# Patient Record
Sex: Male | Born: 1960 | Race: White | Hispanic: No | Marital: Married | Smoking: Former smoker
Health system: Southern US, Community
[De-identification: ages and names within clinical notes are randomized; demographics above are authoritative.]

## PROBLEM LIST (undated history)

## (undated) DIAGNOSIS — D72819 Decreased white blood cell count, unspecified: Secondary | ICD-10-CM

## (undated) DIAGNOSIS — C61 Malignant neoplasm of prostate: Secondary | ICD-10-CM

## (undated) DIAGNOSIS — D563 Thalassemia minor: Secondary | ICD-10-CM

## (undated) HISTORY — DX: Thalassemia minor: D56.3

## (undated) HISTORY — PX: PROSTATE BIOPSY: SHX241

## (undated) HISTORY — DX: Decreased white blood cell count, unspecified: D72.819

---

## 2010-06-18 ENCOUNTER — Ambulatory Visit: Payer: Self-pay | Admitting: Hematology & Oncology

## 2010-09-09 ENCOUNTER — Ambulatory Visit (HOSPITAL_BASED_OUTPATIENT_CLINIC_OR_DEPARTMENT_OTHER): Payer: Federal, State, Local not specified - PPO | Admitting: Hematology & Oncology

## 2010-09-09 ENCOUNTER — Other Ambulatory Visit: Payer: Self-pay | Admitting: Hematology & Oncology

## 2010-09-09 DIAGNOSIS — D72819 Decreased white blood cell count, unspecified: Secondary | ICD-10-CM

## 2010-09-09 DIAGNOSIS — D649 Anemia, unspecified: Secondary | ICD-10-CM

## 2010-09-09 LAB — CBC WITH DIFFERENTIAL (CANCER CENTER ONLY)
BASO#: 0 10*3/uL (ref 0.0–0.2)
BASO%: 0.9 % (ref 0.0–2.0)
EOS%: 6.8 % (ref 0.0–7.0)
HCT: 45 % (ref 38.7–49.9)
HGB: 15.5 g/dL (ref 13.0–17.1)
LYMPH%: 48.1 % — ABNORMAL HIGH (ref 14.0–48.0)
MCHC: 34.4 g/dL (ref 32.0–35.9)
MCV: 74 fL — ABNORMAL LOW (ref 82–98)
NEUT%: 30.5 % — ABNORMAL LOW (ref 40.0–80.0)
RDW: 15.3 % (ref 11.1–15.7)

## 2010-09-11 LAB — IRON AND TIBC: TIBC: 289 ug/dL (ref 215–435)

## 2010-09-11 LAB — HEMOGLOBINOPATHY EVALUATION
Hgb A2 Quant: 6.3 % — ABNORMAL HIGH (ref 2.2–3.2)
Hgb A: 93.3 % — ABNORMAL LOW (ref 96.8–97.8)
Hgb F Quant: 0.4 % (ref 0.0–2.0)
Hgb S Quant: 0 % (ref 0.0–0.0)

## 2010-09-11 LAB — LACTATE DEHYDROGENASE: LDH: 107 U/L (ref 94–250)

## 2010-09-11 LAB — RHEUMATOID FACTOR: Rhuematoid fact SerPl-aCnc: <10 IU/mL (ref <=14)

## 2010-09-11 LAB — ANA: Anti Nuclear Antibody(ANA): NEGATIVE

## 2010-12-18 ENCOUNTER — Telehealth: Payer: Self-pay | Admitting: *Deleted

## 2010-12-18 ENCOUNTER — Telehealth: Payer: Self-pay | Admitting: Hematology & Oncology

## 2010-12-18 NOTE — Telephone Encounter (Signed)
Alexis Swanson at [facility] requested New Patient Evaluation dated 09/09/10.  Faxed to [phone].

## 2010-12-18 NOTE — Telephone Encounter (Signed)
Left message with 1-14 time

## 2011-02-17 ENCOUNTER — Encounter: Payer: Self-pay | Admitting: Hematology & Oncology

## 2011-02-17 ENCOUNTER — Other Ambulatory Visit (HOSPITAL_BASED_OUTPATIENT_CLINIC_OR_DEPARTMENT_OTHER): Payer: Federal, State, Local not specified - PPO | Admitting: Lab

## 2011-02-17 ENCOUNTER — Other Ambulatory Visit: Payer: Self-pay | Admitting: Hematology & Oncology

## 2011-02-17 ENCOUNTER — Telehealth: Payer: Self-pay | Admitting: Hematology & Oncology

## 2011-02-17 ENCOUNTER — Ambulatory Visit: Payer: Federal, State, Local not specified - PPO | Admitting: Hematology & Oncology

## 2011-02-17 ENCOUNTER — Other Ambulatory Visit: Payer: Federal, State, Local not specified - PPO | Admitting: Lab

## 2011-02-17 ENCOUNTER — Ambulatory Visit (HOSPITAL_BASED_OUTPATIENT_CLINIC_OR_DEPARTMENT_OTHER): Payer: Federal, State, Local not specified - PPO | Admitting: Hematology & Oncology

## 2011-02-17 DIAGNOSIS — D563 Thalassemia minor: Secondary | ICD-10-CM

## 2011-02-17 DIAGNOSIS — D72819 Decreased white blood cell count, unspecified: Secondary | ICD-10-CM

## 2011-02-17 HISTORY — DX: Thalassemia minor: D56.3

## 2011-02-17 HISTORY — DX: Decreased white blood cell count, unspecified: D72.819

## 2011-02-17 LAB — CBC WITH DIFFERENTIAL (CANCER CENTER ONLY)
BASO#: 0 10*3/uL (ref 0.0–0.2)
Eosinophils Absolute: 0.3 10*3/uL (ref 0.0–0.5)
HGB: 16.6 g/dL (ref 13.0–17.1)
LYMPH%: 50.6 % — ABNORMAL HIGH (ref 14.0–48.0)
MCV: 75 fL — ABNORMAL LOW (ref 82–98)
MONO#: 0.4 10*3/uL (ref 0.1–0.9)
Platelets: 202 10*3/uL (ref 145–400)
RBC: 6.55 10*6/uL — ABNORMAL HIGH (ref 4.20–5.70)
WBC: 3.1 10*3/uL — ABNORMAL LOW (ref 4.0–10.0)

## 2011-02-17 LAB — RETICULOCYTES (CHCC): Retic Ct Pct: 0.6 % (ref 0.4–2.3)

## 2011-02-17 LAB — CHCC SATELLITE - SMEAR

## 2011-02-17 LAB — IRON AND TIBC: %SAT: 51 % (ref 20–55)

## 2011-02-17 NOTE — Progress Notes (Signed)
CC:   Jackie Plum, M.D.  DIAGNOSES: 1. Leukopenia, chronic, benign. 2. Beta thalassemia trait.  CURRENT THERAPY:  Observation.  INTERIM HISTORY:  Alexis Swanson comes in for followup.  We first saw him back in August.  At that point in time, everything looked fine with respect to his workup for the leukopenia.  His vitamin B12 was normal.  He had a negative ANA and rheumatoid factor.  We did do a hemoglobin electrophoresis on him.  Surprisingly enough, he does not have sickle cell.  He did not have any hemoglobin S with the hemoglobin electrophoresis.  He did have an elevated hemoglobin A2. Therefore, I would classify him as a beta thalassemia trait.  He is still working full time.  He had no problems over the holidays. He was busy.  There has been no cough or shortness breath.  He has no problems with the "flu."  PHYSICAL EXAM:  General:  This is a well-developed, well-nourished black gentleman in no obvious distress.  Vital Signs:  Temperature of 98, pulse 73, respiratory rate 14, blood pressure 119/67.  Weight is 186. Head/Neck:  Exam shows a normocephalic, atraumatic skull.  There are no ocular or oral lesions.  There are no palpable cervical or supraclavicular lymph nodes.  Lungs:  Clear bilaterally.  Cardiac: Regular rate and rhythm with a normal S1, S2.  There are no murmurs, rubs, or bruits.  Abdomen:  Soft with good bowel sounds.  There is no palpable abdominal mass.  There is no fluid wave.  There is no palpable hepatosplenomegaly.  Extremities:  No clubbing, cyanosis or edema. There is no tenderness to palpation of his bones.  Skin:  No rash, ecchymosis or petechiae.  Neurologic:  No focal neurological deficits.  LABORATORY STUDIES:  White cell count 3.1, hemoglobin 16.6, hematocrit 49, platelet count 202.  MCV 75.  White cell differential shows 26 segs, 51 lymphocytes, 13 monos, 10 eosinophils.  Peripheral smear shows good maturation of his red blood cells.  He does  have a few target cells.  I see no nucleated red blood cells.  He has no teardrop cells.  There is no rouleaux formation.  White cells are mature.  He has an increase in lymphocytes.  The lymphocytes do appear to be mature without any large atypical lymphocytes.  He has a slight increase in eosinophils, again which appear mature.  Platelets are adequate in number and size.  IMPRESSION:  Alexis Swanson is a 51 year old African gentleman with leukopenia.  I have to believe that this is a chronic benign leukopenia. All his blood work looks good.  His blood smear looks fine.  I believe that we can just get him back in 6 more months.  I think if everything looks good in 6 months, then we can probably let him go from the clinic.  He does have a thalassemia trait.  He is asymptomatic with this.  He is on folic acid, which is critical.    ______________________________ Josph Macho, M.D. PRE/MEDQ  D:  02/17/2011  T:  02/17/2011  Job:  [id number]  ADDENDUM:  Ferritin is 477 and %Sat is 51.

## 2011-02-17 NOTE — Progress Notes (Signed)
This office note has been dictated.

## 2011-02-17 NOTE — Telephone Encounter (Signed)
Mailed 08-13-11 schedule

## 2011-04-02 ENCOUNTER — Other Ambulatory Visit (HOSPITAL_BASED_OUTPATIENT_CLINIC_OR_DEPARTMENT_OTHER): Payer: Self-pay | Admitting: Internal Medicine

## 2011-04-02 ENCOUNTER — Ambulatory Visit (HOSPITAL_BASED_OUTPATIENT_CLINIC_OR_DEPARTMENT_OTHER)
Admission: RE | Admit: 2011-04-02 | Discharge: 2011-04-02 | Disposition: A | Discharge status: Home / Self Care | Payer: Federal, State, Local not specified - PPO | Source: Ambulatory Visit | Attending: Internal Medicine | Admitting: Internal Medicine

## 2011-04-02 DIAGNOSIS — R52 Pain, unspecified: Secondary | ICD-10-CM

## 2011-04-02 DIAGNOSIS — M25559 Pain in unspecified hip: Secondary | ICD-10-CM | POA: Insufficient documentation

## 2011-04-02 MED ORDER — IOHEXOL 300 MG/ML  SOLN: 100.0000 mL | Freq: Once | INTRAMUSCULAR | Status: AC | PRN

## 2011-04-05 ENCOUNTER — Ambulatory Visit (HOSPITAL_BASED_OUTPATIENT_CLINIC_OR_DEPARTMENT_OTHER)
Admission: RE | Admit: 2011-04-05 | Discharge: 2011-04-05 | Disposition: A | Discharge status: Home / Self Care | Payer: Federal, State, Local not specified - PPO | Source: Ambulatory Visit | Attending: Internal Medicine | Admitting: Internal Medicine

## 2011-04-05 DIAGNOSIS — M8560 Other cyst of bone, unspecified site: Secondary | ICD-10-CM | POA: Insufficient documentation

## 2011-04-05 DIAGNOSIS — N400 Enlarged prostate without lower urinary tract symptoms: Secondary | ICD-10-CM

## 2011-04-05 DIAGNOSIS — R209 Unspecified disturbances of skin sensation: Secondary | ICD-10-CM

## 2011-04-05 DIAGNOSIS — M25559 Pain in unspecified hip: Secondary | ICD-10-CM

## 2011-04-05 DIAGNOSIS — M8569 Other cyst of bone, multiple sites: Secondary | ICD-10-CM

## 2011-04-05 DIAGNOSIS — R52 Pain, unspecified: Secondary | ICD-10-CM

## 2011-08-11 ENCOUNTER — Other Ambulatory Visit (HOSPITAL_BASED_OUTPATIENT_CLINIC_OR_DEPARTMENT_OTHER): Payer: Federal, State, Local not specified - PPO | Admitting: Lab

## 2011-08-11 ENCOUNTER — Ambulatory Visit (HOSPITAL_BASED_OUTPATIENT_CLINIC_OR_DEPARTMENT_OTHER): Payer: Federal, State, Local not specified - PPO | Admitting: Hematology & Oncology

## 2011-08-11 VITALS — BP 125/69 | HR 72 | Temp 97.7°F | Ht 71.0 in | Wt 190.0 lb

## 2011-08-11 DIAGNOSIS — D72819 Decreased white blood cell count, unspecified: Secondary | ICD-10-CM

## 2011-08-11 DIAGNOSIS — D563 Thalassemia minor: Secondary | ICD-10-CM

## 2011-08-11 LAB — CBC WITH DIFFERENTIAL (CANCER CENTER ONLY)
BASO#: 0.1 10*3/uL (ref 0.0–0.2)
BASO%: 1 % (ref 0.0–2.0)
EOS%: 7 % (ref 0.0–7.0)
MCH: 25.6 pg — ABNORMAL LOW (ref 28.0–33.4)
MCHC: 33.7 g/dL (ref 32.0–35.9)
MONO%: 12.9 % (ref 0.0–13.0)
NEUT#: 1.6 10*3/uL (ref 1.5–6.5)
Platelets: 188 10*3/uL (ref 145–400)
RDW: 14.7 % (ref 11.1–15.7)

## 2011-08-11 LAB — CHCC SATELLITE - SMEAR

## 2011-08-11 NOTE — Progress Notes (Signed)
CC:   Jackie Plum, M.D.  DIAGNOSES: 1. Chronic leukopenia, benign. 2. Beta thalassemia trait.  CURRENT THERAPY:  Observation.  INTERIM HISTORY:  Mr. Alexis Swanson comes in for followup.  We last saw him back in January.  Since then he has been doing real well.  He is working without any difficulty.  He has had no problem with infections.  He has had no nausea or vomiting.  There has been no rash.  He has had no change in bowel or bladder habits.  PHYSICAL EXAMINATION:  General:  This is a well-developed, well- nourished black gentleman in no obvious distress.  Vital signs: Temperature 97.7, pulse 72, respiratory rate 18, blood pressure 125/69. Weight is 190.  Head and neck:  Shows a normocephalic, atraumatic skull. There are no ocular or oral lesions.  There are no palpable cervical or supraclavicular lymph nodes.  Lungs:  Clear bilaterally.  Cardiac: Regular rate and rhythm with a normal S1 and S2.  There are no murmurs, rubs or bruits.  Abdomen:  Soft with good bowel sounds.  There is no palpable abdominal mass.  There is no fluid wave.  There is no palpable hepatosplenomegaly.  Back:  No tenderness over the spine, ribs or hips. Extremities:  Show no clubbing, cyanosis or edema.  Neurological:  Shows no focal neurological deficits.  LABORATORY STUDIES:  White cell count 4.9, hemoglobin 14.9, hematocrit 44.2, platelet count 188.  IMPRESSION:  Mr. Alexis Swanson is a 51 year old gentleman with transient leukopenia.  I think this is not surprising to me.  I suspect that blood counts will rise and fall.  I just do not think this will be unusual.  We will go ahead and plan to get him back in 6 more months.  I do not see that we need any blood work in-between visits.    ______________________________ Josph Macho, M.D. PRE/MEDQ  D:  08/11/2011  T:  08/11/2011  Job:  [id number]

## 2011-08-11 NOTE — Progress Notes (Signed)
This office note has been dictated.

## 2011-08-13 ENCOUNTER — Other Ambulatory Visit: Payer: Federal, State, Local not specified - PPO | Admitting: Lab

## 2011-08-13 ENCOUNTER — Ambulatory Visit: Payer: Federal, State, Local not specified - PPO | Admitting: Hematology & Oncology

## 2011-12-05 ENCOUNTER — Other Ambulatory Visit: Payer: Self-pay | Admitting: *Deleted

## 2011-12-05 DIAGNOSIS — D563 Thalassemia minor: Secondary | ICD-10-CM

## 2011-12-05 DIAGNOSIS — D72819 Decreased white blood cell count, unspecified: Secondary | ICD-10-CM

## 2011-12-05 MED ORDER — FOLIC ACID 1 MG PO TABS: 1.0000 mg | ORAL_TABLET | Freq: Two times a day (BID) | ORAL | Status: DC

## 2011-12-05 NOTE — Telephone Encounter (Signed)
Received refill request from [facility] for Folic Acid 1 mg BID. This is a chronic med for the pt. Sent via e-prescribe.

## 2012-02-09 ENCOUNTER — Ambulatory Visit (HOSPITAL_BASED_OUTPATIENT_CLINIC_OR_DEPARTMENT_OTHER): Payer: Federal, State, Local not specified - PPO | Admitting: Hematology & Oncology

## 2012-02-09 ENCOUNTER — Other Ambulatory Visit (HOSPITAL_BASED_OUTPATIENT_CLINIC_OR_DEPARTMENT_OTHER): Payer: Federal, State, Local not specified - PPO | Admitting: Lab

## 2012-02-09 VITALS — BP 113/61 | HR 63 | Temp 98.0°F | Resp 18 | Ht 72.0 in | Wt 196.0 lb

## 2012-02-09 DIAGNOSIS — D72819 Decreased white blood cell count, unspecified: Secondary | ICD-10-CM

## 2012-02-09 LAB — CBC WITH DIFFERENTIAL (CANCER CENTER ONLY)
BASO%: 0.8 % (ref 0.0–2.0)
LYMPH%: 50.4 % — ABNORMAL HIGH (ref 14.0–48.0)
MCV: 75 fL — ABNORMAL LOW (ref 82–98)
MONO#: 0.5 10*3/uL (ref 0.1–0.9)
NEUT#: 0.9 10*3/uL — ABNORMAL LOW (ref 1.5–6.5)
Platelets: 177 10*3/uL (ref 145–400)
RDW: 15.8 % — ABNORMAL HIGH (ref 11.1–15.7)
WBC: 3.6 10*3/uL — ABNORMAL LOW (ref 4.0–10.0)

## 2012-02-09 LAB — CHCC SATELLITE - SMEAR

## 2012-02-09 NOTE — Progress Notes (Signed)
This office note has been dictated.

## 2012-02-10 NOTE — Progress Notes (Signed)
CC:   Jackie Plum, M.D.  DIAGNOSES: 1. Benign chronic leukopenia, ethnicity-associated. 2. Beta thalassemia trait.  CURRENT THERAPY:  Observation.  INTERIM HISTORY:  Alexis Swanson comes in for followup.  We last saw him back in July.  Since then, he has had no problems.  He has had no fevers, sweats, or chills.  He has had no rashes.  He is still working full-time driving a truck locally.  He has had no rashes.  He has had no change in bowel or bladder habits. There has been no change in medications.  PHYSICAL EXAMINATION:  This is a well-developed, well-nourished African American gentleman in no obvious distress.  Vital signs:  Temperature of 98, pulse 63, respiratory rate 18, blood pressure 113/61.  Weight is 196.  Head and neck:  Normocephalic, atraumatic skull.  There are no ocular or oral lesions.  There are no palpable cervical or supraclavicular lymph nodes.  Lungs:  Clear bilaterally.  Cardiac: Regular rate and rhythm with a normal S1, S2.  There are no murmurs, rubs, or bruits.  Abdomen:  Soft with good bowel sounds.  There is no palpable abdominal mass.  There is no palpable hepatosplenomegaly. Extremities:  Shows no clubbing, cyanosis or edema.  Skin:  No rashes, ecchymosis, or petechia.  LABORATORY STUDIES:  White cell count is 3.6, hemoglobin 15.3, hematocrit 46.5, platelet count 177.  White cell differential shows 25 segs, 50 lymphs, 12 monos, and 11 eosinophils.  Peripheral smear shows a normochromic, normocytic population of red blood cells.  He has a couple of target cells.  There is no nucleated red blood cells.  White cells are mature.  He does have an increase in lymphocytes which appear mature.  There are no hypersegmented polys. There are no immature myeloid cells.  There may be slight increase in eosinophils.  Platelets appear adequate.  IMPRESSION:  Alexis Swanson is a 52 year old African American gentleman with leukopenia.  I have to believe that this is a  benign leukopenia associated with his African ethnicity.  This is a definite disease state that is recognized clinically now.  I just do not see anything on his blood smear that looks troublesome. The fact that he has an increase in lymphocytes goes along with the ethnic-associated leukopenia.  He does have a slight increase in eosinophils.  Again, this may fluctuate, but I do not believe this is clinically significant.  I told Mr. Vick that as nice as he is, I do not think we need to see him back in the office.  I just do not think we are going to do anything for him.  Again, we had good fellowship.  He is a real nice guy.    ______________________________ Josph Macho, M.D. PRE/MEDQ  D:  02/09/2012  T:  02/10/2012  Job:  [id number]

## 2013-07-28 IMAGING — CT CT HIP*R* W/CM
3 of 7 series · 14 of 46 positions shown, 16 images · IV contrast (agent unspecified)
Comparison: None.

CLINICAL DATA: Right hip pain.

CT OF THE RIGHT HIP WITH CONTRAST
TECHNIQUE: Multidetector CT imaging was performed following the
standard protocol during bolus administration of intravenous
contrast.
Contrast:  100 ml of Imnipaque-K44.

[Series 4: pelvis 2.0 coronal · coronal · 0.44mm/px · 3 of 100 slices shown]
[im 25/100  soft-tissue]
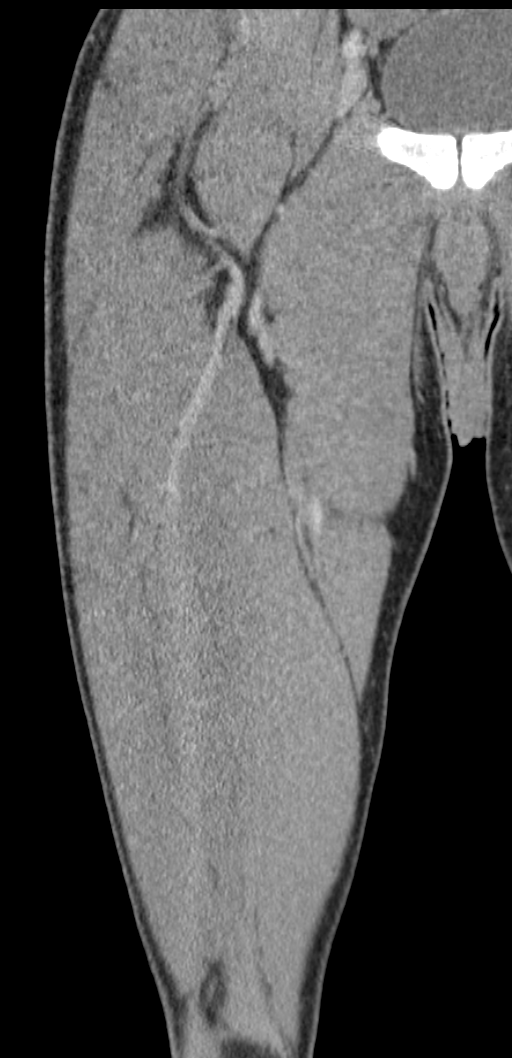
[im 50/100  soft-tissue]
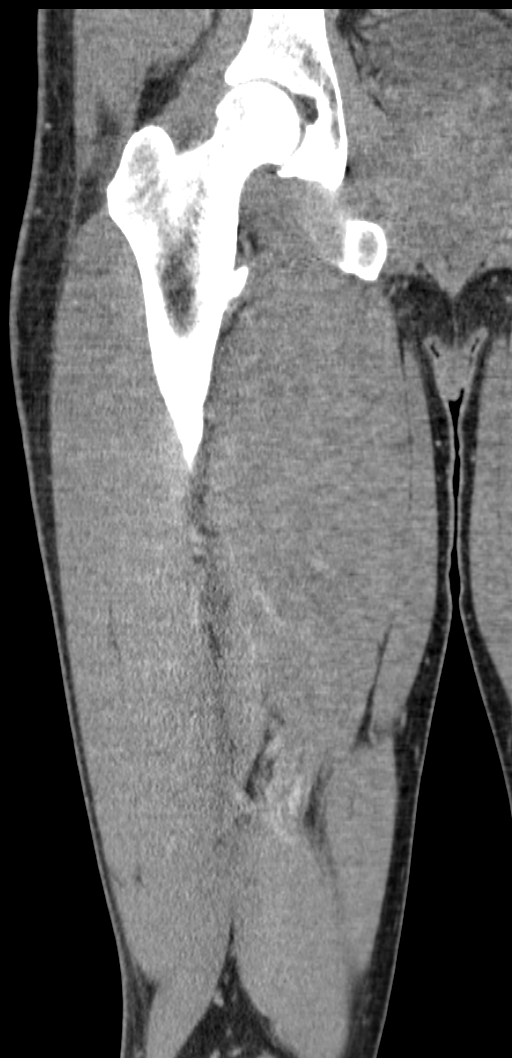
[im 75/100  soft-tissue]
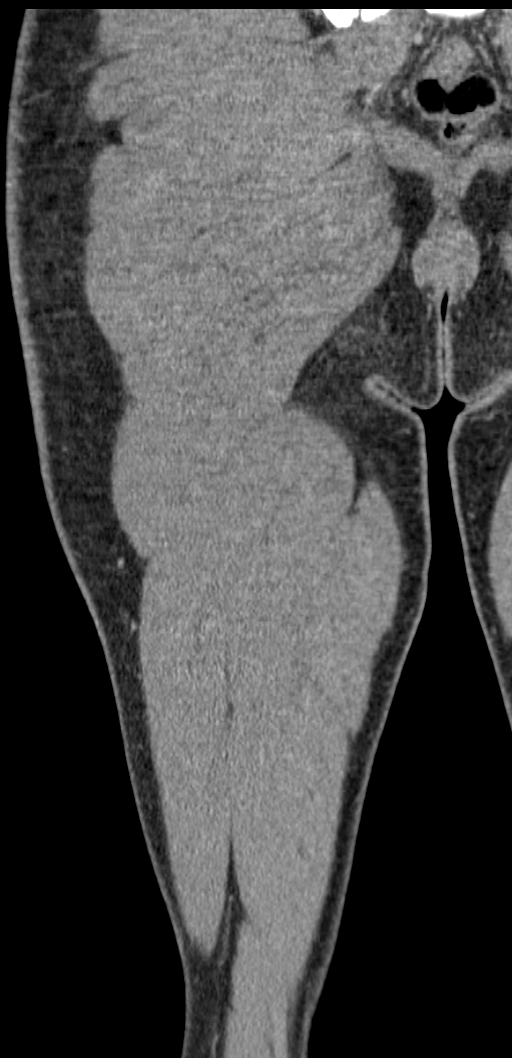

[Series 7: pelvis 5.0 b31f · axial · 0.46mm/px · z∈[+706,+1066]mm · 9 of 91 slices shown, 11 images (1 of 2)]
[im 10/91  soft-tissue]
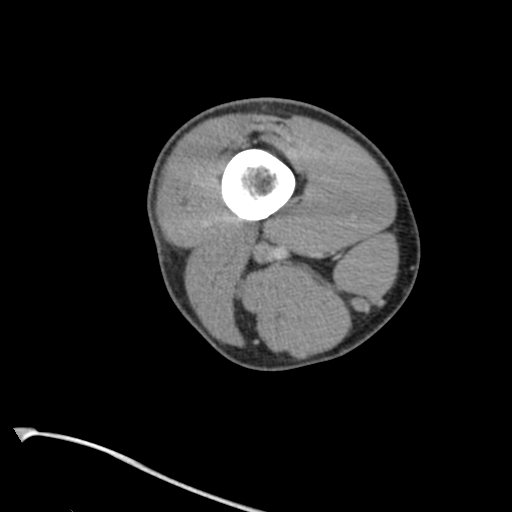
[im 10/91  bone]
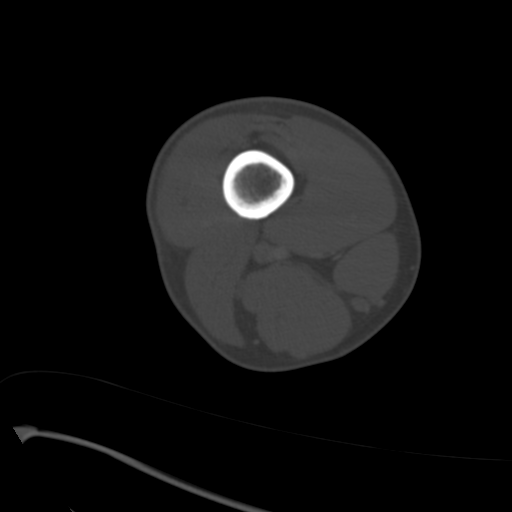
[im 19/91  soft-tissue]
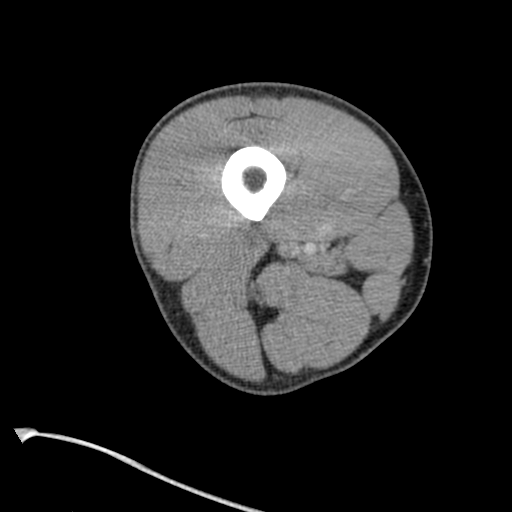
[im 28/91  soft-tissue]
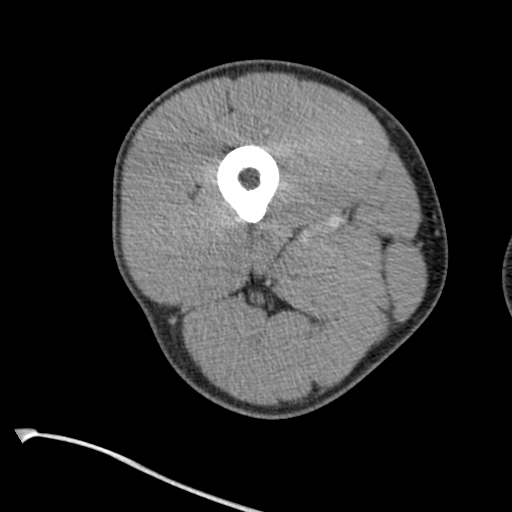
[im 37/91  soft-tissue]
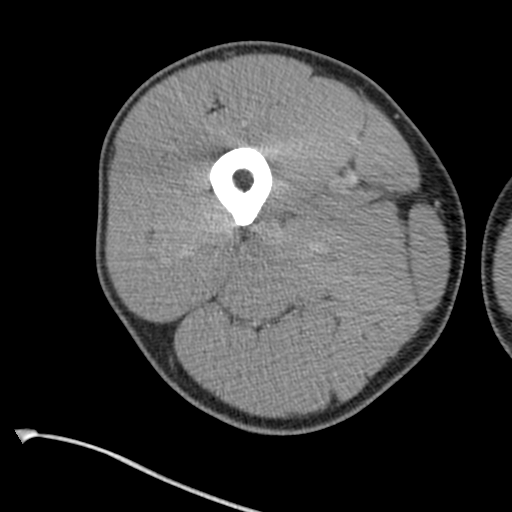
[im 46/91  soft-tissue]
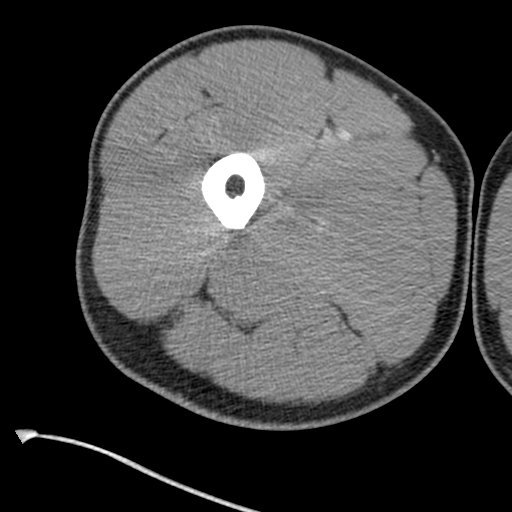
[im 55/91  soft-tissue]
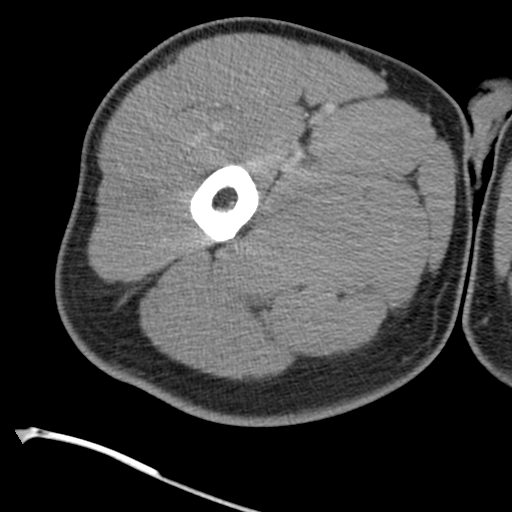
[im 64/91  soft-tissue]
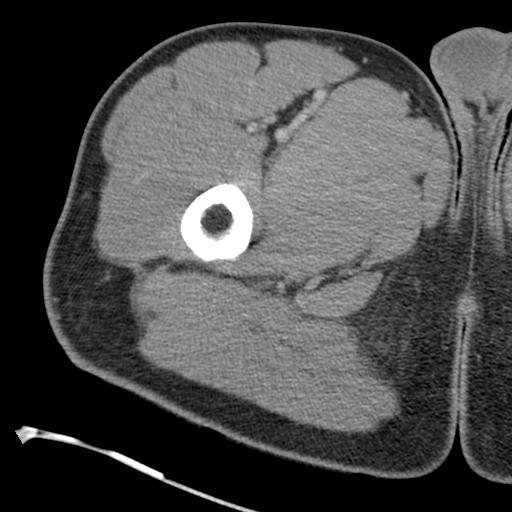
[im 73/91  soft-tissue]
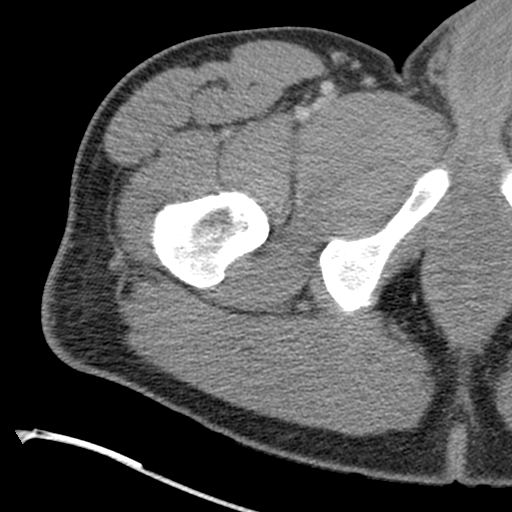
[im 82/91  soft-tissue]
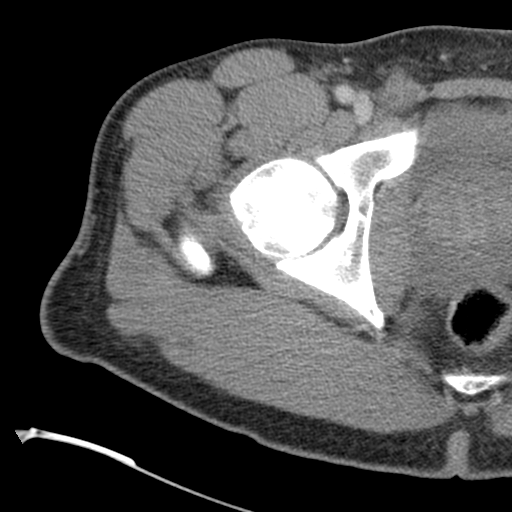
[im 82/91  bone]
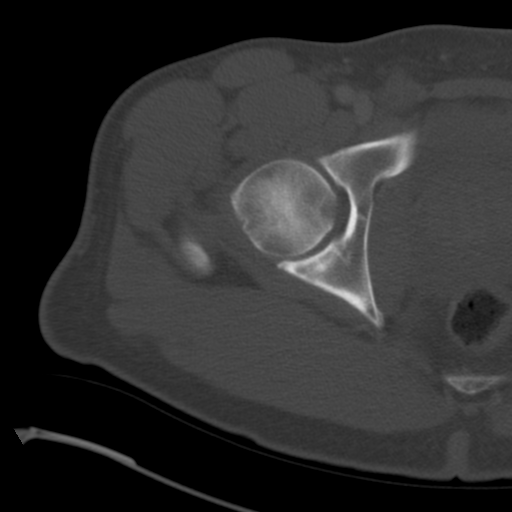

[Series 10: pelvis 5.0 b31f · axial · 0.46mm/px · z∈[+706,+751]mm · 2 of 91 slices shown (2 of 2)]
[im 10/91  soft-tissue]
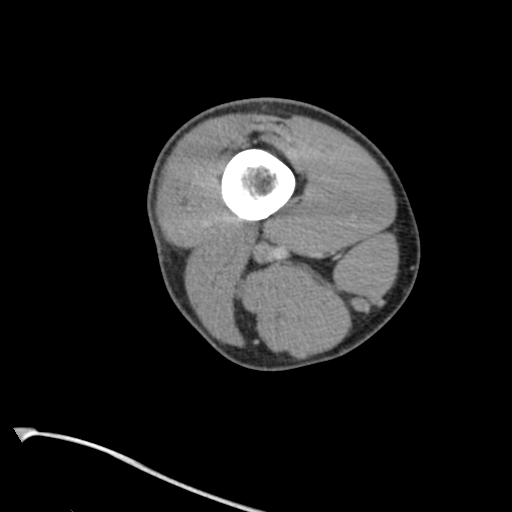
[im 19/91  soft-tissue]
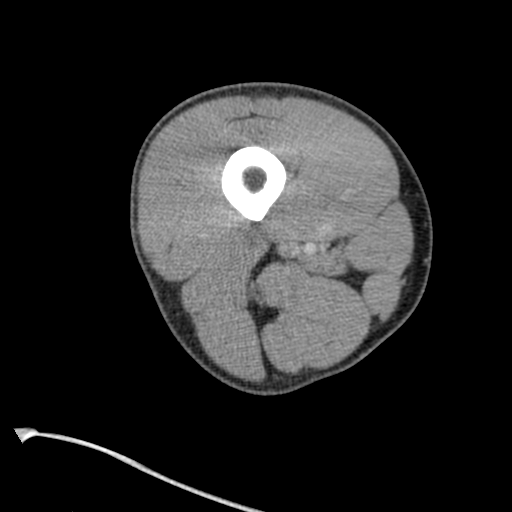

[14 of 46 positions shown; findings below may reference images not displayed]

FINDINGS: The subcutaneous tissues appear normal.  No mass or
hematoma.  The right-sided pelvic, hip and thigh musculature are
grossly normal.

The right hip joint is maintained.  No significant degenerative
changes, stress fracture or CT evidence of avascular necrosis.  No
bone lesions.

No inguinal mass or hernia.  The visualized portions of the pelvis
are grossly normal.  Prostate gland enlargement is noted.

The major vascular structures appear patent.
IMPRESSION: Unremarkable CT examination of the right hip and thigh.
If symptoms persist MR would be a much better examination to
evaluate the bone marrow and soft tissues.

## 2020-03-14 ENCOUNTER — Encounter: Payer: Self-pay | Admitting: Radiation Oncology

## 2020-03-14 NOTE — Progress Notes (Signed)
Received message that patient request education reference prostate radiation. Difficult to provide education without knowing treatment plan. Mailed patient educational material as requested. Dog eared specific pages in [place] handbook I suspect will be pertinent. Included spaceoar information, simulation day information and a fact sheet about external beam radiation therapy.

## 2020-04-09 ENCOUNTER — Encounter: Payer: Self-pay | Admitting: Radiation Oncology

## 2020-04-09 DIAGNOSIS — Z860100 Personal history of colon polyps, unspecified: Secondary | ICD-10-CM | POA: Insufficient documentation

## 2020-04-09 NOTE — Progress Notes (Signed)
GU Location of Tumor / Histology: prostatic adenocarcinoma  If Prostate Cancer, Gleason Score is (3 + 4) and PSA is (13.5) Prostate volume: 133.4 g  Julieanne Cotton explains he was first told his PSA was elevated in September 2021. Patient confirms his father has a history of prostate cancer.  Biopsies of prostate (if applicable) revealed:   Past/Anticipated interventions by urology, if any: prostate biopsy, referral to Dr. Alinda Money to discuss surgical options, referral to Dr. Tammi Klippel to discuss radiation options  Past/Anticipated interventions by medical oncology, if any: no  Weight changes, if any: no  Bowel/Bladder complaints, if any: IPSS 2. SHIM 23. Denies dysuria, hematuria, urinary leakage or incontinence.Reports occasional urinary urgency and frequency of which doesn't bother him. Reports nocturia x 1.  Denies any bowel complaints  Nausea/Vomiting, if any: no  Pain issues, if any:  denies  SAFETY ISSUES:  Prior radiation? denies  Pacemaker/ICD? denies  Possible current pregnancy? no, male patient  Is the patient on methotrexate? denies  Current Complaints / other details:  60 year old male. Married. Resides in [place]. Father with history of prostate ca.

## 2020-04-10 ENCOUNTER — Ambulatory Visit
Admission: RE | Admit: 2020-04-10 | Discharge: 2020-04-10 | Disposition: A | Discharge status: Home / Self Care | Payer: Federal, State, Local not specified - PPO | Source: Ambulatory Visit | Attending: Radiation Oncology | Admitting: Radiation Oncology

## 2020-04-10 ENCOUNTER — Encounter: Payer: Self-pay | Admitting: Radiation Oncology

## 2020-04-10 ENCOUNTER — Other Ambulatory Visit: Payer: Self-pay

## 2020-04-10 ENCOUNTER — Encounter: Payer: Self-pay | Admitting: Urology

## 2020-04-10 VITALS — Ht 71.0 in | Wt 205.0 lb

## 2020-04-10 DIAGNOSIS — C61 Malignant neoplasm of prostate: Secondary | ICD-10-CM

## 2020-04-10 HISTORY — DX: Malignant neoplasm of prostate: C61

## 2020-04-10 NOTE — Progress Notes (Signed)
Radiation Oncology         [phone] ________________________________  Initial Outpatient Consultation - Conducted via Telephone due to current COVID-19 concerns for limiting patient exposure  Name: Alexis Swanson MRN: [id number]  Date: 04/10/2020  DOB: 22-Sep-1960  CC:O'Sullivan, Lenna Sciara, NP  Davis Gourd*   REFERRING PHYSICIAN: Davis Gourd*  DIAGNOSIS: 60 y.o. gentleman with Stage T1c adenocarcinoma of the prostate with Gleason score of 3+4, and PSA of 13.5.    ICD-10-CM   1. Malignant neoplasm of prostate ([place])  C61     HISTORY OF PRESENT ILLNESS: Alexis Swanson is a 60 y.o. male with a diagnosis of prostate cancer. He was noted to have an elevated PSA of 13.7 by his primary care physician, Dr. Delfina Redwood.  Accordingly, he was referred for evaluation in urology by Dr. Lovena Neighbours on 01/02/2020,  digital rectal examination was performed at that time revealing some asymmetry of the prostate gland with a larger left lobe but no discrete nodules.  A repeat PSA performed the same day showed stable elevation at 13.5. The patient proceeded to transrectal ultrasound with 12 biopsies of the prostate on 02/23/2020.  The prostate volume measured 133.4 cc.  Out of 12 core biopsies, 2 were positive.  The maximum Gleason score was 3+4, and this was seen in the right mid lateral. Additionally, a small focus of Gleason 3+3 was seen in the right apex.  The patient reviewed the biopsy results with his urologist and he has kindly been referred today for discussion of potential radiation treatment options. He met with Dr. Alinda Money on 03/23/2020 to discuss prostatectomy.    PREVIOUS RADIATION THERAPY: No  PAST MEDICAL HISTORY:  Past Medical History:  Diagnosis Date   Beta thalassemia trait 02/17/2011   Leukopenia 02/17/2011   Leukopenia 02/17/2011   Prostate cancer ([place])       PAST SURGICAL HISTORY: Past Surgical History:  Procedure Laterality Date   PROSTATE BIOPSY      FAMILY  HISTORY:  Family History  Problem Relation Age of Onset   Prostate cancer Father    Breast cancer Neg Hx    Pancreatic cancer Neg Hx    Colon cancer Neg Hx     SOCIAL HISTORY:  Social History   Socioeconomic History   Marital status: Married    Spouse name: Not on file   Number of children: Not on file   Years of education: Not on file   Highest education level: Not on file  Occupational History    Comment: truck driver  Tobacco Use   Smoking status: Former Smoker    Packs/day: 0.25    Years: 1.00    Pack years: 0.25    Types: Cigarettes    Quit date: 12/05/1979    Years since quitting: 40.3   Smokeless tobacco: Never Used  Vaping Use   Vaping Use: Never used  Substance and Sexual Activity   Alcohol use: Not Currently   Drug use: Never   Sexual activity: Yes  Other Topics Concern   Not on file  Social History Narrative   Not on file   Social Determinants of Health   Financial Resource Strain: Not on file  Food Insecurity: Not on file  Transportation Needs: Not on file  Physical Activity: Not on file  Stress: Not on file  Social Connections: Not on file  Intimate Partner Violence: Not on file    ALLERGIES: Patient has no known allergies.  MEDICATIONS:  Current Outpatient Medications  Medication Sig Dispense  Refill   Misc Natural Products (PROSTATE SUPPORT) 300-15 MG TABS See admin instructions.     Multiple Vitamin (MULTIVITAMIN ADULT PO)      No current facility-administered medications for this encounter.    REVIEW OF SYSTEMS:  On review of systems, the patient reports that he is doing well overall. He denies any chest pain, shortness of breath, cough, fevers, chills, night sweats, unintended weight changes. He denies any bowel disturbances, and denies abdominal pain, nausea or vomiting. He denies any new musculoskeletal or joint aches or pains. His IPSS was 2, indicating mild urinary symptoms. He reports occasional, not-bothersome  urinary urgency and frequency, as well as nocturia x1. His SHIM was 23, indicating he does not have erectile dysfunction. A complete review of systems is obtained and is otherwise negative.    PHYSICAL EXAM:  Wt Readings from Last 3 Encounters:  04/10/20 205 lb (93 kg)  02/09/12 196 lb (88.9 kg)  08/11/11 190 lb (86.2 kg)   Temp Readings from Last 3 Encounters:  02/09/12 98 F (36.7 C) (Oral)  08/11/11 97.7 F (36.5 C) (Oral)  02/17/11 98 F (36.7 C) (Oral)   BP Readings from Last 3 Encounters:  02/09/12 113/61  08/11/11 125/69  02/17/11 119/67   Pulse Readings from Last 3 Encounters:  02/09/12 63  08/11/11 72  02/17/11 73   Pain Assessment Pain Score: 0-No pain/10  Physical exam not performed in light of telephone consult visit format.   KPS = 100  100 - Normal; no complaints; no evidence of disease. 90   - Able to carry on normal activity; minor signs or symptoms of disease. 80   - Normal activity with effort; some signs or symptoms of disease. 33   - Cares for self; unable to carry on normal activity or to do active work. 60   - Requires occasional assistance, but is able to care for most of his personal needs. 50   - Requires considerable assistance and frequent medical care. 77   - Disabled; requires special care and assistance. 31   - Severely disabled; hospital admission is indicated although death not imminent. 32   - Very sick; hospital admission necessary; active supportive treatment necessary. 10   - Moribund; fatal processes progressing rapidly. 0     - Dead  Karnofsky DA, Abelmann [place], Craver LS and Burchenal [facility] [phone]) The use of the nitrogen mustards in the palliative treatment of carcinoma: with particular reference to bronchogenic carcinoma Cancer 1 634-56  LABORATORY DATA:  Lab Results  Component Value Date   WBC 3.6 (L) 02/09/2012   HGB 15.3 02/09/2012   HCT 46.5 02/09/2012   MCV 75 (L) 02/09/2012   PLT 177 02/09/2012   No results found for:  NA, K, CL, CO2 No results found for: ALT, AST, GGT, ALKPHOS, BILITOT   RADIOGRAPHY: No results found.    IMPRESSION/PLAN: This visit was conducted via Telephone to spare the patient unnecessary potential exposure in the healthcare setting during the current COVID-19 pandemic. 1. 60 y.o. gentleman with Stage T1c adenocarcinoma of the prostate with Gleason Score of 3+4, and PSA of 13.5. We discussed the patient's workup and outlined the nature of prostate cancer in this setting. The patient's T stage, Gleason's score, and PSA put him into the intermediate risk group. Accordingly, he is eligible for a variety of potential treatment options including brachytherapy, 5.5 weeks of external radiation, or prostatectomy. We discussed the available radiation techniques, and focused on the details and logistics of  delivery. The patient is not a candidate for brachytherapy with a prostate volume of 133 cc.  Therefore, we discussed and outlined the risks, benefits, short and long-term effects associated with external beam radiotherapy and compared and contrasted these with prostatectomy. We discussed the role of SpaceOAR in reducing the rectal toxicity associated with radiotherapy. He appears to have a good understanding of his disease and our treatment recommendations which are of curative intent.  He was encouraged to ask questions that were answered to his stated satisfaction.  At the end of the conversation, the patient appears to be leaning towards proceeding with 5.5 weeks of EBRT but expressed some interest in further exploring prostate SBRT with one of the radiation oncologist at [facility].  We advised that we would be happy to make a referral if he wishes to pursue the SBRT.  He plans to give this further thought and will let us know in the near future.  We will share our discussion with Dr. Lovena Neighbours. He has our contact information and plans to contact us or [facility] with his final decision in the near  future. We look forward to hearing from him and potentially continuing to participate in his care should he elect to proceed with radiotherapy.   Given current concerns for patient exposure during the COVID-19 pandemic, this encounter was conducted via telephone. The patient was notified in advance and was offered a MyChart meeting to allow for face to face communication but unfortunately reported that he did not have the appropriate resources/technology to support such a visit and instead preferred to proceed with telephone consult. The patient has given verbal consent for this type of encounter. The time spent during this encounter was 45 minutes. The attendants for this meeting include Tyler Pita MD, Ashlyn Bruning PA-C, [place], and patient Torrez Renfroe. During the encounter, Tyler Pita MD, Ashlyn Bruning PA-C, and scribe, Wilburn Mylar were located at [place].  Patient Jaycen Vercher was located at home.    Nicholos Johns, PA-C    Tyler Pita, MD  [facility] Direct Dial: [phone]   Fax: [phone] [email or web address]   Skype   LinkedIn  This document serves as a record of services personally performed by Tyler Pita, MD and Freeman Caldron, PA-C. It was created on their behalf by Wilburn Mylar, a trained medical scribe. The creation of this record is based on the scribe's personal observations and the provider's statements to them. This document has been checked and approved by the attending provider.

## 2020-04-25 ENCOUNTER — Other Ambulatory Visit: Payer: Self-pay | Admitting: Urology

## 2020-04-25 DIAGNOSIS — C61 Malignant neoplasm of prostate: Secondary | ICD-10-CM

## 2020-05-03 ENCOUNTER — Telehealth: Payer: Self-pay | Admitting: *Deleted

## 2020-05-03 NOTE — Telephone Encounter (Signed)
CALLED PATIENT TO UPDATE REGARDING [place] APPT. @ [facility], SPOKE WITH PATIENT

## 2020-05-08 ENCOUNTER — Telehealth: Payer: Self-pay | Admitting: *Deleted

## 2020-05-08 NOTE — Telephone Encounter (Signed)
CALLED [facility] TO CHECK ON AN APPT. FOR THIS PATIENT, SPOKE WITH PATIENT AND HE HASN'T HEARD FROM THEM YET, I TOLD HIM THAT I WOULD CONTINUE TO CALL DAILY UNTIL I GET HIM AN APPT., PATIENT VERIFIED UNDERSTANDING THIS

## 2020-05-25 ENCOUNTER — Telehealth: Payer: Self-pay | Admitting: *Deleted

## 2020-05-25 NOTE — Telephone Encounter (Signed)
RETURNED PATIENT'S PHONE CALL, LVM FOR A RETURN CALL 

## 2020-05-28 ENCOUNTER — Telehealth: Payer: Self-pay | Admitting: Radiation Oncology

## 2020-05-28 NOTE — Telephone Encounter (Signed)
-----   Message from Freeman Caldron, [place] sent at 05/25/2020  3:52 PM EDT ----- Regarding: FW: DECISION Please call patient to confirm his treatment decision and let me know what his preference is so that I can help coordinate treatment. Thank you! -Ash ----- Message ----- From: Kerri Perches Sent: 05/25/2020   2:48 PM EDT To: Freeman Caldron, PA-C Subject: DECISION                                       Hi Ashlyn,   This patient has made a decision, he asks that you give him a call.  Alexis Swanson phone number is [phone].  Thanks,  [employer]

## 2020-05-28 NOTE — Telephone Encounter (Signed)
Phoned patient as requested by Freeman Caldron, PA-C. Patient reports his desire to move forward with 5.5 weeks of EBRT. Explained this RN would relay this information and our scheduling department will be in touch with next steps. Patient verbalized understanding and appreciation for the return call.

## 2020-06-08 ENCOUNTER — Telehealth: Payer: Self-pay | Admitting: *Deleted

## 2020-06-08 NOTE — Telephone Encounter (Signed)
CALLED PATIENT TO INFORM OF FID. MARKER AND SPACE OAR PLACEMENT ON 07-26-20 @ [facility] AND HIS SIM ON 07-31-20 - ARRIVAL TIME- 10:15 AM @ Alexis Swanson, SPOKE WITH PATIENT'S WIFE- MARIAN AND SHE IS AWARE OF THESE APPTS.

## 2020-06-18 ENCOUNTER — Telehealth: Payer: Self-pay | Admitting: *Deleted

## 2020-06-18 NOTE — Telephone Encounter (Signed)
Returned patient's phone call, lvm for a return call 

## 2020-07-30 ENCOUNTER — Telehealth: Payer: Self-pay | Admitting: *Deleted

## 2020-07-30 NOTE — Telephone Encounter (Signed)
CALLED PATIENT TO REMIND OF SIM APPT. FOR 07-31-20- ARRIVAL TIME- 10:15 AM @ [facility], SPOKE WITH PATIENT AND HE IS AWARE OF THIS APPT.

## 2020-07-31 ENCOUNTER — Ambulatory Visit
Admission: RE | Admit: 2020-07-31 | Discharge: 2020-07-31 | Disposition: A | Discharge status: Home / Self Care | Payer: No Typology Code available for payment source | Source: Ambulatory Visit | Attending: Radiation Oncology | Admitting: Radiation Oncology

## 2020-07-31 ENCOUNTER — Other Ambulatory Visit: Payer: Self-pay

## 2020-07-31 ENCOUNTER — Telehealth: Payer: Self-pay | Admitting: Radiation Oncology

## 2020-07-31 DIAGNOSIS — Z510 Encounter for antineoplastic radiation therapy: Secondary | ICD-10-CM | POA: Insufficient documentation

## 2020-07-31 DIAGNOSIS — C61 Malignant neoplasm of prostate: Secondary | ICD-10-CM | POA: Diagnosis not present

## 2020-07-31 NOTE — Telephone Encounter (Signed)
Patient delivered [facility] paperwork. Documents placed in appropriate folder.

## 2020-07-31 NOTE — Progress Notes (Signed)
  Radiation Oncology         [phone] ________________________________  Name: Alexis Swanson MRN: [id number]  Date: 07/31/2020  DOB: 01-07-1961  SIMULATION AND TREATMENT PLANNING NOTE    ICD-10-CM   1. Malignant neoplasm of prostate ([place])  C61       DIAGNOSIS:  60 y.o. gentleman with Stage T1c adenocarcinoma of the prostate with Gleason score of 3+4, and PSA of 13.5.  NARRATIVE:  The patient was brought to the [facility].  Identity was confirmed.  All relevant records and images related to the planned course of therapy were reviewed.  The patient freely provided informed written consent to proceed with treatment after reviewing the details related to the planned course of therapy. The consent form was witnessed and verified by the simulation staff.  Then, the patient was set-up in a stable reproducible supine position for radiation therapy.  A vacuum lock pillow device was custom fabricated to position his legs in a reproducible immobilized position.  Then, I performed a urethrogram under sterile conditions to identify the prostatic apex.  CT images were obtained.  Surface markings were placed.  The CT images were loaded into the planning software.  Then the prostate target and avoidance structures including the rectum, bladder, bowel and hips were contoured.  Treatment planning then occurred.  The radiation prescription was entered and confirmed.  A total of one complex treatment devices was fabricated. I have requested : Intensity Modulated Radiotherapy (IMRT) is medically necessary for this case for the following reason:  Rectal sparing.Marland Kitchen  PLAN:  The patient will receive 70 Gy in 28 fractions.  ________________________________  Sheral Apley Tammi Klippel, M.D.

## 2020-08-03 DIAGNOSIS — C61 Malignant neoplasm of prostate: Secondary | ICD-10-CM | POA: Diagnosis present

## 2020-08-03 DIAGNOSIS — Z510 Encounter for antineoplastic radiation therapy: Secondary | ICD-10-CM | POA: Insufficient documentation

## 2020-08-08 ENCOUNTER — Ambulatory Visit: Payer: No Typology Code available for payment source

## 2020-08-09 ENCOUNTER — Ambulatory Visit: Payer: No Typology Code available for payment source

## 2020-08-09 ENCOUNTER — Other Ambulatory Visit: Payer: Self-pay

## 2020-08-09 ENCOUNTER — Ambulatory Visit
Admission: RE | Admit: 2020-08-09 | Discharge: 2020-08-09 | Disposition: A | Discharge status: Home / Self Care | Payer: No Typology Code available for payment source | Source: Ambulatory Visit | Attending: Radiation Oncology | Admitting: Radiation Oncology

## 2020-08-09 DIAGNOSIS — C61 Malignant neoplasm of prostate: Secondary | ICD-10-CM | POA: Diagnosis not present

## 2020-08-10 ENCOUNTER — Ambulatory Visit
Admission: RE | Admit: 2020-08-10 | Discharge: 2020-08-10 | Disposition: A | Discharge status: Home / Self Care | Payer: No Typology Code available for payment source | Source: Ambulatory Visit | Attending: Radiation Oncology | Admitting: Radiation Oncology

## 2020-08-10 ENCOUNTER — Telehealth: Payer: Self-pay

## 2020-08-10 DIAGNOSIS — C61 Malignant neoplasm of prostate: Secondary | ICD-10-CM | POA: Diagnosis not present

## 2020-08-10 NOTE — Telephone Encounter (Signed)
Notified Spouse Jinny Sanders that FMLA Paperwork would be ready for pickup on Wednesday of next week due to provider being out of the office at this time

## 2020-08-10 NOTE — Progress Notes (Signed)
Pt here for patient teaching.    Pt given Radiation and You booklet and skin care instructions.    Reviewed areas of pertinence such as diarrhea, fatigue, hair loss, nausea and vomiting, sexual and fertility changes, skin changes, and urinary and bladder changes .   Pt able to give teach back of to pat skin, use unscented/gentle soap, have Imodium on hand, and drink plenty of water,avoid applying anything to skin within 4 hours of treatment.   Pt verbalizes understanding of information given and will contact nursing with any questions or concerns.    [email or web address]/whattoexpect/index

## 2020-08-13 ENCOUNTER — Ambulatory Visit
Admission: RE | Admit: 2020-08-13 | Discharge: 2020-08-13 | Disposition: A | Discharge status: Home / Self Care | Payer: No Typology Code available for payment source | Source: Ambulatory Visit | Attending: Radiation Oncology | Admitting: Radiation Oncology

## 2020-08-13 ENCOUNTER — Other Ambulatory Visit: Payer: Self-pay

## 2020-08-13 DIAGNOSIS — C61 Malignant neoplasm of prostate: Secondary | ICD-10-CM | POA: Diagnosis not present

## 2020-08-14 ENCOUNTER — Ambulatory Visit
Admission: RE | Admit: 2020-08-14 | Discharge: 2020-08-14 | Disposition: A | Discharge status: Home / Self Care | Payer: No Typology Code available for payment source | Source: Ambulatory Visit | Attending: Radiation Oncology | Admitting: Radiation Oncology

## 2020-08-14 DIAGNOSIS — C61 Malignant neoplasm of prostate: Secondary | ICD-10-CM | POA: Diagnosis not present

## 2020-08-15 ENCOUNTER — Ambulatory Visit
Admission: RE | Admit: 2020-08-15 | Discharge: 2020-08-15 | Disposition: A | Discharge status: Home / Self Care | Payer: No Typology Code available for payment source | Source: Ambulatory Visit | Attending: Radiation Oncology | Admitting: Radiation Oncology

## 2020-08-15 ENCOUNTER — Other Ambulatory Visit: Payer: Self-pay

## 2020-08-15 DIAGNOSIS — C61 Malignant neoplasm of prostate: Secondary | ICD-10-CM | POA: Diagnosis not present

## 2020-08-16 ENCOUNTER — Ambulatory Visit
Admission: RE | Admit: 2020-08-16 | Discharge: 2020-08-16 | Disposition: A | Discharge status: Home / Self Care | Payer: No Typology Code available for payment source | Source: Ambulatory Visit | Attending: Radiation Oncology | Admitting: Radiation Oncology

## 2020-08-16 DIAGNOSIS — C61 Malignant neoplasm of prostate: Secondary | ICD-10-CM | POA: Diagnosis not present

## 2020-08-17 ENCOUNTER — Other Ambulatory Visit: Payer: Self-pay

## 2020-08-17 ENCOUNTER — Ambulatory Visit
Admission: RE | Admit: 2020-08-17 | Discharge: 2020-08-17 | Disposition: A | Discharge status: Home / Self Care | Payer: No Typology Code available for payment source | Source: Ambulatory Visit | Attending: Radiation Oncology | Admitting: Radiation Oncology

## 2020-08-17 DIAGNOSIS — C61 Malignant neoplasm of prostate: Secondary | ICD-10-CM | POA: Diagnosis not present

## 2020-08-20 ENCOUNTER — Ambulatory Visit
Admission: RE | Admit: 2020-08-20 | Discharge: 2020-08-20 | Disposition: A | Discharge status: Home / Self Care | Payer: No Typology Code available for payment source | Source: Ambulatory Visit | Attending: Radiation Oncology | Admitting: Radiation Oncology

## 2020-08-20 ENCOUNTER — Other Ambulatory Visit: Payer: Self-pay

## 2020-08-20 DIAGNOSIS — C61 Malignant neoplasm of prostate: Secondary | ICD-10-CM | POA: Diagnosis not present

## 2020-08-21 ENCOUNTER — Ambulatory Visit
Admission: RE | Admit: 2020-08-21 | Discharge: 2020-08-21 | Disposition: A | Discharge status: Home / Self Care | Payer: No Typology Code available for payment source | Source: Ambulatory Visit | Attending: Radiation Oncology | Admitting: Radiation Oncology

## 2020-08-21 DIAGNOSIS — C61 Malignant neoplasm of prostate: Secondary | ICD-10-CM | POA: Diagnosis not present

## 2020-08-22 ENCOUNTER — Other Ambulatory Visit: Payer: Self-pay

## 2020-08-22 ENCOUNTER — Ambulatory Visit
Admission: RE | Admit: 2020-08-22 | Discharge: 2020-08-22 | Disposition: A | Discharge status: Home / Self Care | Payer: No Typology Code available for payment source | Source: Ambulatory Visit | Attending: Radiation Oncology | Admitting: Radiation Oncology

## 2020-08-22 DIAGNOSIS — C61 Malignant neoplasm of prostate: Secondary | ICD-10-CM | POA: Diagnosis not present

## 2020-08-23 ENCOUNTER — Ambulatory Visit
Admission: RE | Admit: 2020-08-23 | Discharge: 2020-08-23 | Disposition: A | Discharge status: Home / Self Care | Payer: No Typology Code available for payment source | Source: Ambulatory Visit | Attending: Radiation Oncology | Admitting: Radiation Oncology

## 2020-08-23 DIAGNOSIS — C61 Malignant neoplasm of prostate: Secondary | ICD-10-CM | POA: Diagnosis not present

## 2020-08-24 ENCOUNTER — Other Ambulatory Visit: Payer: Self-pay

## 2020-08-24 ENCOUNTER — Ambulatory Visit
Admission: RE | Admit: 2020-08-24 | Discharge: 2020-08-24 | Disposition: A | Discharge status: Home / Self Care | Payer: No Typology Code available for payment source | Source: Ambulatory Visit | Attending: Radiation Oncology | Admitting: Radiation Oncology

## 2020-08-24 DIAGNOSIS — C61 Malignant neoplasm of prostate: Secondary | ICD-10-CM | POA: Diagnosis not present

## 2020-08-27 ENCOUNTER — Other Ambulatory Visit: Payer: Self-pay

## 2020-08-27 ENCOUNTER — Ambulatory Visit
Admission: RE | Admit: 2020-08-27 | Discharge: 2020-08-27 | Disposition: A | Discharge status: Home / Self Care | Payer: No Typology Code available for payment source | Source: Ambulatory Visit | Attending: Radiation Oncology | Admitting: Radiation Oncology

## 2020-08-27 DIAGNOSIS — C61 Malignant neoplasm of prostate: Secondary | ICD-10-CM | POA: Diagnosis not present

## 2020-08-28 ENCOUNTER — Ambulatory Visit
Admission: RE | Admit: 2020-08-28 | Discharge: 2020-08-28 | Disposition: A | Discharge status: Home / Self Care | Payer: No Typology Code available for payment source | Source: Ambulatory Visit | Attending: Radiation Oncology | Admitting: Radiation Oncology

## 2020-08-28 DIAGNOSIS — C61 Malignant neoplasm of prostate: Secondary | ICD-10-CM | POA: Diagnosis not present

## 2020-08-29 ENCOUNTER — Ambulatory Visit
Admission: RE | Admit: 2020-08-29 | Discharge: 2020-08-29 | Disposition: A | Discharge status: Home / Self Care | Payer: No Typology Code available for payment source | Source: Ambulatory Visit | Attending: Radiation Oncology | Admitting: Radiation Oncology

## 2020-08-29 ENCOUNTER — Other Ambulatory Visit: Payer: Self-pay

## 2020-08-29 DIAGNOSIS — C61 Malignant neoplasm of prostate: Secondary | ICD-10-CM | POA: Diagnosis not present

## 2020-08-30 ENCOUNTER — Ambulatory Visit
Admission: RE | Admit: 2020-08-30 | Discharge: 2020-08-30 | Disposition: A | Discharge status: Home / Self Care | Payer: No Typology Code available for payment source | Source: Ambulatory Visit | Attending: Radiation Oncology | Admitting: Radiation Oncology

## 2020-08-30 DIAGNOSIS — C61 Malignant neoplasm of prostate: Secondary | ICD-10-CM | POA: Diagnosis not present

## 2020-08-31 ENCOUNTER — Ambulatory Visit
Admission: RE | Admit: 2020-08-31 | Discharge: 2020-08-31 | Disposition: A | Discharge status: Home / Self Care | Payer: No Typology Code available for payment source | Source: Ambulatory Visit | Attending: Radiation Oncology | Admitting: Radiation Oncology

## 2020-08-31 ENCOUNTER — Other Ambulatory Visit: Payer: Self-pay

## 2020-08-31 DIAGNOSIS — C61 Malignant neoplasm of prostate: Secondary | ICD-10-CM | POA: Diagnosis not present

## 2020-09-03 ENCOUNTER — Ambulatory Visit
Admission: RE | Admit: 2020-09-03 | Discharge: 2020-09-03 | Disposition: A | Discharge status: Home / Self Care | Payer: No Typology Code available for payment source | Source: Ambulatory Visit | Attending: Radiation Oncology | Admitting: Radiation Oncology

## 2020-09-03 ENCOUNTER — Other Ambulatory Visit: Payer: Self-pay

## 2020-09-03 DIAGNOSIS — C61 Malignant neoplasm of prostate: Secondary | ICD-10-CM | POA: Insufficient documentation

## 2020-09-03 DIAGNOSIS — Z510 Encounter for antineoplastic radiation therapy: Secondary | ICD-10-CM | POA: Insufficient documentation

## 2020-09-04 ENCOUNTER — Ambulatory Visit
Admission: RE | Admit: 2020-09-04 | Discharge: 2020-09-04 | Disposition: A | Discharge status: Home / Self Care | Payer: No Typology Code available for payment source | Source: Ambulatory Visit | Attending: Radiation Oncology | Admitting: Radiation Oncology

## 2020-09-04 DIAGNOSIS — C61 Malignant neoplasm of prostate: Secondary | ICD-10-CM | POA: Diagnosis not present

## 2020-09-05 ENCOUNTER — Ambulatory Visit
Admission: RE | Admit: 2020-09-05 | Discharge: 2020-09-05 | Disposition: A | Discharge status: Home / Self Care | Payer: No Typology Code available for payment source | Source: Ambulatory Visit | Attending: Radiation Oncology | Admitting: Radiation Oncology

## 2020-09-05 DIAGNOSIS — C61 Malignant neoplasm of prostate: Secondary | ICD-10-CM | POA: Diagnosis not present

## 2020-09-06 ENCOUNTER — Ambulatory Visit
Admission: RE | Admit: 2020-09-06 | Discharge: 2020-09-06 | Disposition: A | Discharge status: Home / Self Care | Payer: No Typology Code available for payment source | Source: Ambulatory Visit | Attending: Radiation Oncology | Admitting: Radiation Oncology

## 2020-09-06 ENCOUNTER — Other Ambulatory Visit: Payer: Self-pay

## 2020-09-06 DIAGNOSIS — C61 Malignant neoplasm of prostate: Secondary | ICD-10-CM | POA: Diagnosis not present

## 2020-09-07 ENCOUNTER — Ambulatory Visit
Admission: RE | Admit: 2020-09-07 | Discharge: 2020-09-07 | Disposition: A | Discharge status: Home / Self Care | Payer: No Typology Code available for payment source | Source: Ambulatory Visit | Attending: Radiation Oncology | Admitting: Radiation Oncology

## 2020-09-07 ENCOUNTER — Ambulatory Visit: Payer: No Typology Code available for payment source

## 2020-09-07 DIAGNOSIS — C61 Malignant neoplasm of prostate: Secondary | ICD-10-CM | POA: Diagnosis not present

## 2020-09-10 ENCOUNTER — Other Ambulatory Visit: Payer: Self-pay

## 2020-09-10 ENCOUNTER — Ambulatory Visit
Admission: RE | Admit: 2020-09-10 | Discharge: 2020-09-10 | Disposition: A | Discharge status: Home / Self Care | Payer: No Typology Code available for payment source | Source: Ambulatory Visit | Attending: Radiation Oncology | Admitting: Radiation Oncology

## 2020-09-10 DIAGNOSIS — C61 Malignant neoplasm of prostate: Secondary | ICD-10-CM | POA: Diagnosis not present

## 2020-09-11 ENCOUNTER — Ambulatory Visit
Admission: RE | Admit: 2020-09-11 | Discharge: 2020-09-11 | Disposition: A | Discharge status: Home / Self Care | Payer: No Typology Code available for payment source | Source: Ambulatory Visit | Attending: Radiation Oncology | Admitting: Radiation Oncology

## 2020-09-11 DIAGNOSIS — C61 Malignant neoplasm of prostate: Secondary | ICD-10-CM | POA: Diagnosis not present

## 2020-09-12 ENCOUNTER — Other Ambulatory Visit: Payer: Self-pay

## 2020-09-12 ENCOUNTER — Ambulatory Visit
Admission: RE | Admit: 2020-09-12 | Discharge: 2020-09-12 | Disposition: A | Discharge status: Home / Self Care | Payer: No Typology Code available for payment source | Source: Ambulatory Visit | Attending: Radiation Oncology | Admitting: Radiation Oncology

## 2020-09-12 DIAGNOSIS — C61 Malignant neoplasm of prostate: Secondary | ICD-10-CM | POA: Diagnosis not present

## 2020-09-13 ENCOUNTER — Ambulatory Visit
Admission: RE | Admit: 2020-09-13 | Discharge: 2020-09-13 | Disposition: A | Discharge status: Home / Self Care | Payer: No Typology Code available for payment source | Source: Ambulatory Visit | Attending: Radiation Oncology | Admitting: Radiation Oncology

## 2020-09-13 DIAGNOSIS — C61 Malignant neoplasm of prostate: Secondary | ICD-10-CM | POA: Diagnosis not present

## 2020-09-14 ENCOUNTER — Ambulatory Visit
Admission: RE | Admit: 2020-09-14 | Discharge: 2020-09-14 | Disposition: A | Discharge status: Home / Self Care | Payer: No Typology Code available for payment source | Source: Ambulatory Visit | Attending: Radiation Oncology | Admitting: Radiation Oncology

## 2020-09-14 ENCOUNTER — Other Ambulatory Visit: Payer: Self-pay

## 2020-09-14 ENCOUNTER — Ambulatory Visit: Payer: No Typology Code available for payment source

## 2020-09-14 DIAGNOSIS — C61 Malignant neoplasm of prostate: Secondary | ICD-10-CM | POA: Diagnosis not present

## 2020-09-17 ENCOUNTER — Ambulatory Visit
Admission: RE | Admit: 2020-09-17 | Discharge: 2020-09-17 | Disposition: A | Discharge status: Home / Self Care | Payer: No Typology Code available for payment source | Source: Ambulatory Visit | Attending: Radiation Oncology | Admitting: Radiation Oncology

## 2020-09-17 ENCOUNTER — Encounter: Payer: Self-pay | Admitting: Urology

## 2020-09-17 ENCOUNTER — Other Ambulatory Visit: Payer: Self-pay

## 2020-09-17 DIAGNOSIS — C61 Malignant neoplasm of prostate: Secondary | ICD-10-CM | POA: Diagnosis not present

## 2020-10-29 ENCOUNTER — Encounter: Payer: Self-pay | Admitting: Urology

## 2020-10-29 NOTE — Progress Notes (Signed)
Patient's spouse Jemarion Roycroft) states patient is doing well. She reports nocturia x2 w/ no other symptoms to report at this time.  I-PSS Score of 2 (mild).  Meaningful use complete.  Patient's spouse notified of 9:30-9/28/22 telephone visit and understands.

## 2020-10-31 ENCOUNTER — Ambulatory Visit
Admission: RE | Admit: 2020-10-31 | Discharge: 2020-10-31 | Disposition: A | Discharge status: Home / Self Care | Payer: No Typology Code available for payment source | Source: Ambulatory Visit | Attending: Urology | Admitting: Urology

## 2020-10-31 DIAGNOSIS — C61 Malignant neoplasm of prostate: Secondary | ICD-10-CM

## 2020-10-31 NOTE — Progress Notes (Signed)
  Radiation Oncology         [phone] ________________________________  Name: Alexis Swanson MRN: [id number]  Date: 09/17/2020  DOB: 26-Jul-1960  End of Treatment Note  Diagnosis:   60 y.o. gentleman with Stage T1c adenocarcinoma of the prostate with Gleason score of 3+4, and PSA of 13.5.     Indication for treatment:  Curative, Definitive Radiotherapy       Radiation treatment dates:   08/09/20 - 09/17/20  Site/dose:   The prostate was treated to 70 Gy in 28 fractions of 2.5 Gy  Beams/energy:   The patient was treated with IMRT using volumetric arc therapy delivering 6 MV X-rays to clockwise and counterclockwise circumferential arcs with a 90 degree collimator offset to avoid dose scalloping.  Image guidance was performed with daily cone beam CT prior to each fraction to align to gold markers in the prostate and assure proper bladder and rectal fill volumes.  Immobilization was achieved with [employer] custom mold.  Narrative: The patient tolerated radiation treatment relatively well with only minor urinary irritation and modest fatigue.  He reported mild increased frequency, urgency and nocturia 2-3 times per night.  He also experienced occasional loose stools but did not require any medication to manage.  Plan: The patient has completed radiation treatment. He will return to radiation oncology clinic for routine followup in one month. I advised him to call or return sooner if he has any questions or concerns related to his recovery or treatment. ________________________________  Sheral Apley. Tammi Klippel, M.D.

## 2020-10-31 NOTE — Progress Notes (Signed)
  Radiation Oncology         [phone] ________________________________  Name: Alexis Swanson MRN: [id number]  Date: 10/31/2020  DOB: 1960-10-18  Post Treatment Note  CC: No primary care provider on file.  Davis Gourd*  Diagnosis:   60 y.o. gentleman with Stage T1c adenocarcinoma of the prostate with Gleason score of 3+4, and PSA of 13.5.  Interval Since Last Radiation:  6.5 weeks  08/09/20 - 09/17/20:   The prostate was treated to 70 Gy in 28 fractions of 2.5 Gy  Narrative:  I spoke with the patient to conduct his routine scheduled 1 month follow up visit via telephone to spare the patient unnecessary potential exposure in the healthcare setting during the current COVID-19 pandemic.  The patient was notified in advance and gave permission to proceed with this visit format.  He tolerated radiation treatment relatively well with only minor urinary irritation and modest fatigue.  He reported mild increased frequency, urgency and nocturia 2-3 times per night.  He also experienced occasional loose stools but did not require any medication to manage.                              On review of systems, the patient states that he is doing well in general.  He reports gradual improvement in his LUTS and is currently without complaints.  His current IPSS score is 2 indicating mild urinary symptoms only.  He specifically denies dysuria, gross hematuria, excessive daytime frequency, urgency, incomplete bladder emptying or incontinence.  He reports a healthy appetite and is maintaining his weight.  His energy level is gradually improving.  He denies abdominal pain, nausea, vomiting, diarrhea or constipation.  Overall, he is quite pleased with his progress to date.  ALLERGIES:  has No Known Allergies.  Meds: Current Outpatient Medications  Medication Sig Dispense Refill   Misc Natural Products (PROSTATE SUPPORT) 300-15 MG TABS See admin instructions.     Multiple Vitamin (MULTIVITAMIN ADULT PO)       No current facility-administered medications for this encounter.    Physical Findings:  vitals were not taken for this visit.  Pain Assessment Pain Score: 0-No pain/10 Unable to assess due to telephone follow-up visit format.  Lab Findings: Lab Results  Component Value Date   WBC 3.6 (L) 02/09/2012   HGB 15.3 02/09/2012   HCT 46.5 02/09/2012   MCV 75 (L) 02/09/2012   PLT 177 02/09/2012     Radiographic Findings: No results found.  Impression/Plan: 1. 60 y.o. gentleman with Stage T1c adenocarcinoma of the prostate with Gleason score of 3+4, and PSA of 13.5. He will continue to follow up with urology for ongoing PSA determinations but does not have a follow-up appointment scheduled with Dr. Lovena Neighbours to his knowledge. He understands what to expect with regards to PSA monitoring going forward. I will look forward to following his response to treatment via correspondence with urology, and would be happy to continue to participate in his care if clinically indicated. I talked to the patient about what to expect in the future, including his risk for erectile dysfunction and rectal bleeding. I encouraged him to call or return to the office if he has any questions regarding his previous radiation or possible radiation side effects. He was comfortable with this plan and will follow up as needed.     Nicholos Johns, PA-C

## 2020-11-07 ENCOUNTER — Other Ambulatory Visit: Payer: Self-pay | Admitting: Urology

## 2020-11-07 DIAGNOSIS — C61 Malignant neoplasm of prostate: Secondary | ICD-10-CM

## 2020-11-14 ENCOUNTER — Telehealth: Payer: Self-pay | Admitting: Adult Health

## 2020-11-14 NOTE — Telephone Encounter (Signed)
Called and [facility] for patient to return our call about a referral we received for a survivorship visit with him.  Call back number given.  Wilber Bihari, NP

## 2021-02-11 ENCOUNTER — Telehealth: Payer: Self-pay | Admitting: *Deleted

## 2021-02-15 ENCOUNTER — Telehealth: Payer: Self-pay | Admitting: *Deleted

## 2021-02-26 ENCOUNTER — Inpatient Hospital Stay: Payer: Self-pay | Attending: Adult Health | Admitting: *Deleted

## 2021-02-26 ENCOUNTER — Other Ambulatory Visit: Payer: Self-pay

## 2021-02-26 ENCOUNTER — Encounter: Payer: Self-pay | Admitting: *Deleted

## 2021-02-26 DIAGNOSIS — C61 Malignant neoplasm of prostate: Secondary | ICD-10-CM

## 2021-02-26 NOTE — Progress Notes (Signed)
°  2 Identifiers used for verification purposes only. No vital signs taken for this visit being this is a telephone visit. SCP reviewed and completed. SDOH assessed and completed. Pt denies pain. No complaints of fatigue. Pt is doing well, he says. Pt has been back to urologist and PSA numbers are decreasing. Pt 's urine flow is steady and normal, he says. He has some urine urgency during the daytime.  Nocturia x 2, so he's sleeping better. Bowels are back to normal. Pt denies any erectile dysfunction. Pt had last colonoscopy in 2022. Pt has an upcoming visit with PCP for annual checkup.Pt has no hx of skin abnormalities and has never seen a dermatologist. Pt does smoke 2-3 cigars a week. States he drinks occasionally, maybe 2 drinks on the weekends.He has not received flu vaccine this year by choice. No Covid vaccines to update.

## 2021-04-25 DIAGNOSIS — C61 Malignant neoplasm of prostate: Secondary | ICD-10-CM | POA: Diagnosis not present

## 2021-07-24 DIAGNOSIS — C61 Malignant neoplasm of prostate: Secondary | ICD-10-CM | POA: Diagnosis not present

## 2021-08-02 DIAGNOSIS — C61 Malignant neoplasm of prostate: Secondary | ICD-10-CM | POA: Diagnosis not present

## 2021-10-29 DIAGNOSIS — C61 Malignant neoplasm of prostate: Secondary | ICD-10-CM | POA: Diagnosis not present

## 2021-11-21 DIAGNOSIS — R7303 Prediabetes: Secondary | ICD-10-CM | POA: Diagnosis not present

## 2021-11-21 DIAGNOSIS — Z0000 Encounter for general adult medical examination without abnormal findings: Secondary | ICD-10-CM | POA: Diagnosis not present

## 2021-11-21 DIAGNOSIS — E7800 Pure hypercholesterolemia, unspecified: Secondary | ICD-10-CM | POA: Diagnosis not present

## 2022-01-16 DIAGNOSIS — C61 Malignant neoplasm of prostate: Secondary | ICD-10-CM | POA: Diagnosis not present

## 2022-01-23 DIAGNOSIS — C61 Malignant neoplasm of prostate: Secondary | ICD-10-CM | POA: Diagnosis not present

## 2022-08-13 DIAGNOSIS — C61 Malignant neoplasm of prostate: Secondary | ICD-10-CM | POA: Diagnosis not present

## 2022-12-04 DIAGNOSIS — Z0000 Encounter for general adult medical examination without abnormal findings: Secondary | ICD-10-CM | POA: Diagnosis not present

## 2022-12-04 DIAGNOSIS — R7303 Prediabetes: Secondary | ICD-10-CM | POA: Diagnosis not present

## 2022-12-04 DIAGNOSIS — E7800 Pure hypercholesterolemia, unspecified: Secondary | ICD-10-CM | POA: Diagnosis not present

## 2023-01-19 DIAGNOSIS — C61 Malignant neoplasm of prostate: Secondary | ICD-10-CM | POA: Diagnosis not present

## 2023-01-26 DIAGNOSIS — C61 Malignant neoplasm of prostate: Secondary | ICD-10-CM | POA: Diagnosis not present

## 2023-07-27 DIAGNOSIS — C61 Malignant neoplasm of prostate: Secondary | ICD-10-CM | POA: Diagnosis not present

## 2023-12-14 ENCOUNTER — Other Ambulatory Visit (HOSPITAL_BASED_OUTPATIENT_CLINIC_OR_DEPARTMENT_OTHER): Payer: Self-pay | Admitting: Internal Medicine

## 2023-12-14 DIAGNOSIS — Z0000 Encounter for general adult medical examination without abnormal findings: Secondary | ICD-10-CM | POA: Diagnosis not present

## 2023-12-14 DIAGNOSIS — E7800 Pure hypercholesterolemia, unspecified: Secondary | ICD-10-CM | POA: Diagnosis not present

## 2023-12-14 DIAGNOSIS — R7303 Prediabetes: Secondary | ICD-10-CM | POA: Diagnosis not present

## 2023-12-14 DIAGNOSIS — C61 Malignant neoplasm of prostate: Secondary | ICD-10-CM | POA: Diagnosis not present

## 2023-12-23 ENCOUNTER — Ambulatory Visit (HOSPITAL_BASED_OUTPATIENT_CLINIC_OR_DEPARTMENT_OTHER)
Admission: RE | Admit: 2023-12-23 | Discharge: 2023-12-23 | Disposition: A | Discharge status: Home / Self Care | Payer: Self-pay | Source: Ambulatory Visit | Attending: Internal Medicine | Admitting: Internal Medicine

## 2023-12-23 DIAGNOSIS — E7800 Pure hypercholesterolemia, unspecified: Secondary | ICD-10-CM | POA: Insufficient documentation

## 2024-01-18 DIAGNOSIS — C61 Malignant neoplasm of prostate: Secondary | ICD-10-CM | POA: Diagnosis not present

## 2024-01-25 DIAGNOSIS — C61 Malignant neoplasm of prostate: Secondary | ICD-10-CM | POA: Diagnosis not present
